# Patient Record
Sex: Female | Born: 1990 | Race: White | Hispanic: No | Marital: Single | Smoking: Current some day smoker
Health system: Southern US, Community
[De-identification: ages and names within clinical notes are randomized; demographics above are authoritative.]

## PROBLEM LIST (undated history)

## (undated) HISTORY — PX: NO PAST SURGERIES: SHX2092

---
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
## 2009-06-05 ENCOUNTER — Encounter: Payer: Self-pay | Admitting: Obstetrics & Gynecology

## 2009-06-05 ENCOUNTER — Ambulatory Visit: Payer: Self-pay | Admitting: Obstetrics & Gynecology

## 2009-06-05 LAB — CONVERTED CEMR LAB
Antibody Screen: NEGATIVE
Basophils Absolute: 0 10*3/uL (ref 0.0–0.1)
Eosinophils Absolute: 0 10*3/uL (ref 0.0–0.7)
Eosinophils Relative: 0 % (ref 0–5)
Lymphocytes Relative: 21 % (ref 12–46)
Monocytes Relative: 5 % (ref 3–12)
Neutro Abs: 8.1 10*3/uL — ABNORMAL HIGH (ref 1.7–7.7)
Rh Type: POSITIVE
WBC: 10.9 10*3/uL — ABNORMAL HIGH (ref 4.0–10.5)

## 2009-06-06 ENCOUNTER — Ambulatory Visit (HOSPITAL_COMMUNITY): Admission: RE | Admit: 2009-06-06 | Discharge: 2009-06-06 | Payer: Self-pay | Admitting: Family Medicine

## 2009-06-09 ENCOUNTER — Ambulatory Visit: Payer: Self-pay | Admitting: Obstetrics and Gynecology

## 2009-07-08 ENCOUNTER — Ambulatory Visit: Payer: Self-pay | Admitting: Obstetrics & Gynecology

## 2009-07-08 LAB — CONVERTED CEMR LAB
Hemoglobin: 11 g/dL — ABNORMAL LOW (ref 12.0–15.0)
MCHC: 32 g/dL (ref 30.0–36.0)
MCV: 85.1 fL (ref 78.0–100.0)
RBC: 4.04 M/uL (ref 3.87–5.11)
RDW: 13.7 % (ref 11.5–15.5)

## 2009-07-23 ENCOUNTER — Ambulatory Visit: Payer: Self-pay | Admitting: Obstetrics & Gynecology

## 2009-08-12 ENCOUNTER — Ambulatory Visit: Payer: Self-pay | Admitting: Obstetrics & Gynecology

## 2009-08-13 ENCOUNTER — Encounter: Payer: Self-pay | Admitting: Obstetrics & Gynecology

## 2009-08-26 ENCOUNTER — Ambulatory Visit: Payer: Self-pay | Admitting: Obstetrics & Gynecology

## 2009-09-03 ENCOUNTER — Observation Stay: Payer: Self-pay

## 2009-09-03 ENCOUNTER — Inpatient Hospital Stay (HOSPITAL_COMMUNITY): Admission: AD | Admit: 2009-09-03 | Discharge: 2009-09-04 | Payer: Self-pay | Admitting: Obstetrics and Gynecology

## 2009-09-03 ENCOUNTER — Ambulatory Visit: Payer: Self-pay | Admitting: Family

## 2009-09-09 ENCOUNTER — Ambulatory Visit: Payer: Self-pay | Admitting: Family Medicine

## 2009-09-09 ENCOUNTER — Encounter: Payer: Self-pay | Admitting: Obstetrics & Gynecology

## 2009-09-10 ENCOUNTER — Encounter: Payer: Self-pay | Admitting: Obstetrics & Gynecology

## 2009-09-10 LAB — CONVERTED CEMR LAB
Chlamydia, DNA Probe: NEGATIVE
GC Probe Amp, Genital: NEGATIVE

## 2009-09-16 ENCOUNTER — Ambulatory Visit: Payer: Self-pay | Admitting: Obstetrics & Gynecology

## 2009-09-29 ENCOUNTER — Inpatient Hospital Stay (HOSPITAL_COMMUNITY): Admission: AD | Admit: 2009-09-29 | Discharge: 2009-10-01 | Payer: Self-pay | Admitting: Obstetrics & Gynecology

## 2009-09-29 ENCOUNTER — Ambulatory Visit: Payer: Self-pay | Admitting: Obstetrics and Gynecology

## 2009-11-04 ENCOUNTER — Ambulatory Visit: Payer: Self-pay | Admitting: Family Medicine

## 2009-11-05 ENCOUNTER — Ambulatory Visit: Payer: Self-pay | Admitting: Obstetrics & Gynecology

## 2010-05-23 LAB — URINALYSIS, DIPSTICK ONLY
Leukocytes, UA: NEGATIVE
Nitrite: NEGATIVE
Urobilinogen, UA: 0.2 mg/dL (ref 0.0–1.0)

## 2010-05-23 LAB — CBC
HCT: 34.9 % — ABNORMAL LOW (ref 36.0–46.0)
MCHC: 33 g/dL (ref 30.0–36.0)
Platelets: 201 10*3/uL (ref 150–400)
RDW: 15.2 % (ref 11.5–15.5)

## 2010-05-23 LAB — COMPREHENSIVE METABOLIC PANEL
ALT: 13 U/L (ref 0–35)
Creatinine, Ser: 0.52 mg/dL (ref 0.4–1.2)
GFR calc non Af Amer: >60 mL/min (ref >60)
Glucose, Bld: 93 mg/dL (ref 70–99)
Potassium: 3.7 mEq/L (ref 3.5–5.1)

## 2010-05-23 LAB — URIC ACID: Uric Acid, Serum: 4.7 mg/dL (ref 2.4–7.0)

## 2010-07-21 NOTE — Assessment & Plan Note (Signed)
NAME:  Shelby Mills, Shelby Mills                  ACCOUNT NO.:  [id number]   MEDICAL RECORD NO.:  [id number]          PATIENT TYPE:  POB   LOCATION:  [facility]         FACILITY:  [facility]   PHYSICIAN:  Tinnie Gens, MD        DATE OF BIRTH:  1991-01-26   DATE OF SERVICE:  11/04/2009                                  CLINIC NOTE   CHIEF COMPLAINT:  Postpartum check.   HISTORY OF PRESENT ILLNESS:  The patient is a 20 year old gravida 2,  para 2 who is status post a vaginal delivery on September 29, 2009, had a  female 8 pounds 9 ounces.  She started breastfeeding; however, she  stopped for now.  She did have protected intercourse approximately 2  weeks ago but desired pregnancy test today.  Pregnancy test today is  negative.  The patient reports that her mood is not well.  She was  depressed during pregnancy.  She was placed on Zoloft initially and then  that was changed to Elavil per the patient's requests.  She is not  wanting anything that will make her tired.  After lengthy discussion we  had regarding risks, benefits of treatment versus not, we decided to go  with Wellbutrin as potential treatment for depression.  The patient does  desire IUD for birth control.   PHYSICAL EXAMINATION:  VITAL SIGNS:  Today, vitals are as in the chart.  GENERAL:  She is well developed, well nourished in no acute distress.  ABDOMEN:  Soft, nontender, nondistended.  GU:  Normal external female genitalia.  BUS is normal.  Vagina is pink  and rugated.  Cervix is parous without lesion.  Uterus is small, well  involuted, mid position.  No adnexal mass or tenderness.   IMPRESSION:  1. Gynecological exam with postpartum check.  2. Undesired fertility.  3. Depression.   PLAN:  1. Wellbutrin 75 mg p.o. b.i.d., start today.  2. Return in 1-2 days for Mirena IUD.  The patient had no positive      cultures in her Pap.  There is no real contraindication for IUD.      Risks and benefits of the IUD were discussed  including side      effects.  The patient understands these and agrees to proceed.           ______________________________  Tinnie Gens, MD     TP/MEDQ  D:  11/04/2009  T:  11/05/2009  Job:  [id number]

## 2012-04-19 ENCOUNTER — Emergency Department: Payer: Self-pay | Admitting: Urology

## 2012-04-23 ENCOUNTER — Emergency Department: Payer: Self-pay | Admitting: Emergency Medicine

## 2015-03-09 NOTE — L&D Delivery Note (Signed)
Patient is 25 y.o. G3P3001 [redacted]w[redacted]d admitted in active labor. She progressed quickly to pushing.   Delivery Note At 11:37 AM a viable female was delivered via Vaginal, Spontaneous Delivery (Presentation:Occiput Anterior).  APGAR: 4, 7; weight 7 lb 6.7 oz (3365 g).   Placenta status: Intact, Spontaneous.  Cord: 3 vessels with the following complications: .  Cord pH: not collected  Anesthesia: None  Episiotomy: None Lacerations: None Suture Repair: None Est. Blood Loss (mL): 100  Mom to postpartum.  Baby to Couplet care / Skin to Skin.  Juanita Craver [facility] 07/18/2015, 2:50 PM

## 2015-04-18 ENCOUNTER — Other Ambulatory Visit (HOSPITAL_COMMUNITY)
Admission: RE | Admit: 2015-04-18 | Discharge: 2015-04-18 | Disposition: A | Discharge status: Home / Self Care | Payer: Medicaid Other | Source: Ambulatory Visit | Attending: Obstetrics & Gynecology | Admitting: Obstetrics & Gynecology

## 2015-04-18 ENCOUNTER — Ambulatory Visit (INDEPENDENT_AMBULATORY_CARE_PROVIDER_SITE_OTHER): Payer: Medicaid Other | Admitting: Obstetrics & Gynecology

## 2015-04-18 ENCOUNTER — Encounter: Payer: Self-pay | Admitting: Obstetrics & Gynecology

## 2015-04-18 VITALS — BP 122/78 | HR 80 | Wt 246.0 lb

## 2015-04-18 DIAGNOSIS — O0932 Supervision of pregnancy with insufficient antenatal care, second trimester: Secondary | ICD-10-CM | POA: Diagnosis not present

## 2015-04-18 DIAGNOSIS — Z8632 Personal history of gestational diabetes: Secondary | ICD-10-CM

## 2015-04-18 DIAGNOSIS — Z124 Encounter for screening for malignant neoplasm of cervix: Secondary | ICD-10-CM

## 2015-04-18 DIAGNOSIS — Z3482 Encounter for supervision of other normal pregnancy, second trimester: Secondary | ICD-10-CM | POA: Diagnosis not present

## 2015-04-18 DIAGNOSIS — F17210 Nicotine dependence, cigarettes, uncomplicated: Secondary | ICD-10-CM

## 2015-04-18 DIAGNOSIS — Z720 Tobacco use: Secondary | ICD-10-CM

## 2015-04-18 DIAGNOSIS — Z3480 Encounter for supervision of other normal pregnancy, unspecified trimester: Secondary | ICD-10-CM

## 2015-04-18 DIAGNOSIS — Z113 Encounter for screening for infections with a predominantly sexual mode of transmission: Secondary | ICD-10-CM | POA: Diagnosis present

## 2015-04-18 DIAGNOSIS — Z01419 Encounter for gynecological examination (general) (routine) without abnormal findings: Secondary | ICD-10-CM | POA: Diagnosis not present
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
## 2015-04-21 ENCOUNTER — Encounter: Payer: Self-pay | Admitting: *Deleted

## 2015-04-21 LAB — CYTOLOGY - PAP

## 2015-04-22 ENCOUNTER — Telehealth: Payer: Self-pay | Admitting: *Deleted

## 2015-04-22 NOTE — Telephone Encounter (Signed)
Called pt to inform her of + CT on pap smear, no answer, left message to call the office.

## 2015-04-25 ENCOUNTER — Ambulatory Visit (HOSPITAL_COMMUNITY): Payer: Medicaid Other

## 2015-04-25 ENCOUNTER — Ambulatory Visit (HOSPITAL_COMMUNITY)
Admission: RE | Admit: 2015-04-25 | Discharge: 2015-04-25 | Disposition: A | Discharge status: Home / Self Care | Payer: Medicaid Other | Source: Ambulatory Visit | Attending: Obstetrics & Gynecology | Admitting: Obstetrics & Gynecology

## 2015-04-25 DIAGNOSIS — O0932 Supervision of pregnancy with insufficient antenatal care, second trimester: Secondary | ICD-10-CM | POA: Diagnosis not present

## 2015-04-25 DIAGNOSIS — O09292 Supervision of pregnancy with other poor reproductive or obstetric history, second trimester: Secondary | ICD-10-CM | POA: Diagnosis present

## 2015-04-25 DIAGNOSIS — Z3A26 26 weeks gestation of pregnancy: Secondary | ICD-10-CM | POA: Diagnosis not present

## 2015-04-25 DIAGNOSIS — Z36 Encounter for antenatal screening of mother: Secondary | ICD-10-CM | POA: Insufficient documentation

## 2015-04-25 DIAGNOSIS — Z3480 Encounter for supervision of other normal pregnancy, unspecified trimester: Secondary | ICD-10-CM

## 2015-04-25 DIAGNOSIS — Z3482 Encounter for supervision of other normal pregnancy, second trimester: Secondary | ICD-10-CM

## 2015-04-29 ENCOUNTER — Telehealth: Payer: Self-pay | Admitting: *Deleted

## 2015-04-29 DIAGNOSIS — A749 Chlamydial infection, unspecified: Secondary | ICD-10-CM

## 2015-04-29 DIAGNOSIS — O98819 Other maternal infectious and parasitic diseases complicating pregnancy, unspecified trimester: Principal | ICD-10-CM

## 2015-04-29 MED ORDER — AZITHROMYCIN 500 MG PO TABS: 1000.0000 mg | ORAL_TABLET | Freq: Once | ORAL | Status: DC
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
## 2015-04-30 ENCOUNTER — Encounter (HOSPITAL_COMMUNITY): Payer: Self-pay

## 2015-04-30 DIAGNOSIS — Z3480 Encounter for supervision of other normal pregnancy, unspecified trimester: Secondary | ICD-10-CM | POA: Insufficient documentation

## 2015-05-16 ENCOUNTER — Encounter: Payer: Medicaid Other | Admitting: Obstetrics & Gynecology

## 2015-05-26 ENCOUNTER — Ambulatory Visit (INDEPENDENT_AMBULATORY_CARE_PROVIDER_SITE_OTHER): Payer: Medicaid Other | Admitting: Obstetrics & Gynecology

## 2015-05-26 ENCOUNTER — Encounter: Payer: Self-pay | Admitting: Obstetrics & Gynecology

## 2015-05-26 ENCOUNTER — Other Ambulatory Visit (HOSPITAL_COMMUNITY)
Admission: RE | Admit: 2015-05-26 | Discharge: 2015-05-26 | Disposition: A | Discharge status: Home / Self Care | Payer: Medicaid Other | Source: Ambulatory Visit | Attending: Obstetrics & Gynecology | Admitting: Obstetrics & Gynecology

## 2015-05-26 VITALS — BP 121/75 | HR 79 | Ht 70.0 in | Wt 239.0 lb

## 2015-05-26 DIAGNOSIS — Z36 Encounter for antenatal screening of mother: Secondary | ICD-10-CM | POA: Diagnosis not present

## 2015-05-26 DIAGNOSIS — O98312 Other infections with a predominantly sexual mode of transmission complicating pregnancy, second trimester: Secondary | ICD-10-CM

## 2015-05-26 DIAGNOSIS — Z3483 Encounter for supervision of other normal pregnancy, third trimester: Secondary | ICD-10-CM

## 2015-05-26 DIAGNOSIS — Z113 Encounter for screening for infections with a predominantly sexual mode of transmission: Secondary | ICD-10-CM | POA: Diagnosis present

## 2015-05-26 DIAGNOSIS — Z23 Encounter for immunization: Secondary | ICD-10-CM | POA: Diagnosis not present

## 2015-05-26 DIAGNOSIS — O98812 Other maternal infectious and parasitic diseases complicating pregnancy, second trimester: Secondary | ICD-10-CM

## 2015-05-26 DIAGNOSIS — Z634 Disappearance and death of family member: Secondary | ICD-10-CM | POA: Insufficient documentation

## 2015-05-26 DIAGNOSIS — Z0489 Encounter for examination and observation for other specified reasons: Secondary | ICD-10-CM

## 2015-05-26 DIAGNOSIS — O2343 Unspecified infection of urinary tract in pregnancy, third trimester: Secondary | ICD-10-CM

## 2015-05-26 DIAGNOSIS — A749 Chlamydial infection, unspecified: Secondary | ICD-10-CM

## 2015-05-26 DIAGNOSIS — IMO0002 Reserved for concepts with insufficient information to code with codable children: Secondary | ICD-10-CM

## 2015-05-26 NOTE — Progress Notes (Signed)
Subjective:  Shelby Mills is a 25 y.o. G3P2000 at [redacted]w[redacted]d being seen today for ongoing prenatal care.  She is currently monitored for the following issues for this low-risk pregnancy and has Encounter for supervision of other normal pregnancy and Chlamydia infection affecting pregnancy in second trimester on her problem list.  Patient reports no complaints.  Contractions: Not present. Vag. Bleeding: None.  Movement: Present. Denies leaking of fluid.   The following portions of the patient's history were reviewed and updated as appropriate: allergies, current medications, past family history, past medical history, past social history, past surgical history and problem list. Problem list updated.  Objective:   Filed Vitals:   05/26/15 1329 05/26/15 1330  BP: 121/75   Pulse: 79   Height:  5\' 10"  (1.778 m)  Weight: 239 lb (108.41 kg)     Fetal Status: Fetal Heart Rate (bpm): 145 Fundal Height: 31 cm Movement: Present     General:  Alert, oriented and cooperative. Patient is in no acute distress.  Skin: Skin is warm and dry. No rash noted.   Cardiovascular: Normal heart rate noted  Respiratory: Normal respiratory effort, no problems with respiration noted  Abdomen: Soft, gravid, appropriate for gestational age. Pain/Pressure: Absent     Pelvic: Vag. Bleeding: None Vag D/C Character: Thin  Cervical exam deferred        Extremities: Normal range of motion.  Edema: None  Mental Status: Normal mood and affect. Normal behavior. Normal judgment and thought content.   Urinalysis: Urine Protein: Trace Urine Glucose: Negative  Assessment and Plan:  Pregnancy: G3P2000 at [redacted]w[redacted]d  1. Evaluate anatomy not seen on prior sonogram Limited anatomy scan - [place] MFM OB FOLLOW UP; Future  2. Chlamydia infection affecting pregnancy in second trimester Test of cure needed today. - GC/Chlamydia probe amp ([place])not at [facility]  3. Need for Tdap vaccination - Tdap vaccine greater than or equal to 7yo IM;  Standing - Tdap vaccine greater than or equal to 7yo IM  4. Encounter for supervision of other normal pregnancy in third trimester Prenatal labs, 1 hr GTT will be done later this week - Culture, OB Urine Preterm labor symptoms and general obstetric precautions including but not limited to vaginal bleeding, contractions, leaking of fluid and fetal movement were reviewed in detail with the patient. Please refer to After Visit Summary for other counseling recommendations.  Return in about 2 days (around 05/28/2015) for 1 hr GTT, 3rd trimester labs.  Then OB visit in 2 weeks.Osborne Oman, MD

## 2015-05-26 NOTE — Patient Instructions (Signed)
Return to clinic for any obstetric concerns or go to [facility]U for evaluation  

## 2015-05-27 LAB — GC/CHLAMYDIA PROBE AMP (~~LOC~~) NOT AT ARMC
Chlamydia: NEGATIVE
Neisseria Gonorrhea: NEGATIVE

## 2015-05-28 ENCOUNTER — Encounter: Payer: Self-pay | Admitting: *Deleted

## 2015-05-28 ENCOUNTER — Other Ambulatory Visit (INDEPENDENT_AMBULATORY_CARE_PROVIDER_SITE_OTHER): Payer: Medicaid Other | Admitting: *Deleted

## 2015-05-28 ENCOUNTER — Ambulatory Visit (HOSPITAL_COMMUNITY): Payer: Medicaid Other

## 2015-05-28 DIAGNOSIS — Z36 Encounter for antenatal screening of mother: Secondary | ICD-10-CM

## 2015-05-28 DIAGNOSIS — Z3483 Encounter for supervision of other normal pregnancy, third trimester: Secondary | ICD-10-CM

## 2015-05-28 LAB — CULTURE, OB URINE: Colony Count: >=100000

## 2015-05-29 LAB — GLUCOSE TOLERANCE, 1 HOUR (50G) W/O FASTING: GLUCOSE, 1 HR, GESTATIONAL: 144 mg/dL — AB (ref <140)

## 2015-05-29 MED ORDER — SULFAMETHOXAZOLE-TRIMETHOPRIM 800-160 MG PO TABS: 1.0000 | ORAL_TABLET | Freq: Two times a day (BID) | ORAL | Status: DC

## 2015-05-29 NOTE — Addendum Note (Signed)
Addended by: Verita Schneiders A on: 05/29/2015 12:21 PM   Modules accepted: Orders

## 2015-05-30 LAB — PRENATAL PROFILE (SOLSTAS)
ANTIBODY SCREEN: NEGATIVE
BASOS ABS: 0 10*3/uL (ref 0.0–0.1)
Basophils Relative: 0 % (ref 0–1)
Eosinophils Absolute: 0.1 10*3/uL (ref 0.0–0.7)
Eosinophils Relative: 1 % (ref 0–5)
HCT: 35.1 % — ABNORMAL LOW (ref 36.0–46.0)
HEMOGLOBIN: 11.6 g/dL — AB (ref 12.0–15.0)
HIV 1&2 Ab, 4th Generation: NONREACTIVE
Hepatitis B Surface Ag: NEGATIVE
LYMPHS ABS: 1.8 10*3/uL (ref 0.7–4.0)
LYMPHS PCT: 18 % (ref 12–46)
MCH: 27.3 pg (ref 26.0–34.0)
MCHC: 33 g/dL (ref 30.0–36.0)
MCV: 82.6 fL (ref 78.0–100.0)
MPV: 10.8 fL (ref 8.6–12.4)
Monocytes Absolute: 0.5 10*3/uL (ref 0.1–1.0)
Monocytes Relative: 5 % (ref 3–12)
NEUTROS PCT: 76 % (ref 43–77)
Neutro Abs: 7.7 10*3/uL (ref 1.7–7.7)
PLATELETS: 212 10*3/uL (ref 150–400)
RBC: 4.25 MIL/uL (ref 3.87–5.11)
RDW: 14.1 % (ref 11.5–15.5)
RUBELLA: 2.04 {index} — AB (ref <0.90)
Rh Type: POSITIVE
WBC: 10.1 10*3/uL (ref 4.0–10.5)

## 2015-06-02 ENCOUNTER — Other Ambulatory Visit: Payer: Self-pay | Admitting: Obstetrics & Gynecology

## 2015-06-02 ENCOUNTER — Ambulatory Visit (HOSPITAL_COMMUNITY)
Admission: RE | Admit: 2015-06-02 | Discharge: 2015-06-02 | Disposition: A | Discharge status: Home / Self Care | Payer: Medicaid Other | Source: Ambulatory Visit | Attending: Obstetrics & Gynecology | Admitting: Obstetrics & Gynecology

## 2015-06-02 DIAGNOSIS — O09292 Supervision of pregnancy with other poor reproductive or obstetric history, second trimester: Secondary | ICD-10-CM

## 2015-06-02 DIAGNOSIS — O3412 Maternal care for benign tumor of corpus uteri, second trimester: Secondary | ICD-10-CM

## 2015-06-02 DIAGNOSIS — O0932 Supervision of pregnancy with insufficient antenatal care, second trimester: Secondary | ICD-10-CM | POA: Insufficient documentation

## 2015-06-02 DIAGNOSIS — D259 Leiomyoma of uterus, unspecified: Secondary | ICD-10-CM

## 2015-06-02 DIAGNOSIS — Z36 Encounter for antenatal screening of mother: Secondary | ICD-10-CM | POA: Insufficient documentation

## 2015-06-02 DIAGNOSIS — Z8632 Personal history of gestational diabetes: Secondary | ICD-10-CM

## 2015-06-02 DIAGNOSIS — IMO0002 Reserved for concepts with insufficient information to code with codable children: Secondary | ICD-10-CM

## 2015-06-02 DIAGNOSIS — Z0489 Encounter for examination and observation for other specified reasons: Secondary | ICD-10-CM

## 2015-06-02 DIAGNOSIS — O09293 Supervision of pregnancy with other poor reproductive or obstetric history, third trimester: Secondary | ICD-10-CM | POA: Insufficient documentation

## 2015-06-02 DIAGNOSIS — O3413 Maternal care for benign tumor of corpus uteri, third trimester: Secondary | ICD-10-CM | POA: Diagnosis not present

## 2015-06-02 DIAGNOSIS — Z3A32 32 weeks gestation of pregnancy: Secondary | ICD-10-CM

## 2015-06-03 ENCOUNTER — Telehealth: Payer: Self-pay | Admitting: *Deleted

## 2015-06-03 NOTE — Telephone Encounter (Signed)
-----   Message from Osborne Oman, MD sent at 05/29/2015 12:23 PM EDT ----- 1 hr GTT 144.  Please schedule for 3 hr GTT.

## 2015-06-03 NOTE — Telephone Encounter (Signed)
-----   Message from Osborne Oman, MD sent at 05/29/2015 12:22 PM EDT ----- Bactrim DS prescribed for UTI, please call to inform patient of results and advise her to pick up prescription.

## 2015-06-03 NOTE — Telephone Encounter (Signed)
Informed pt of urine cx result and need for antibiotic treatment, reviewed medication regimen with pt, acknowledged instructions.

## 2015-06-03 NOTE — Telephone Encounter (Signed)
Informed pt of abn 1 hr GTT and the need for 3 hr GTT, pt scheduled for Monday 06-09-15 at 0830.

## 2015-06-09 ENCOUNTER — Ambulatory Visit (INDEPENDENT_AMBULATORY_CARE_PROVIDER_SITE_OTHER): Payer: Medicaid Other | Admitting: Obstetrics & Gynecology

## 2015-06-09 ENCOUNTER — Other Ambulatory Visit (INDEPENDENT_AMBULATORY_CARE_PROVIDER_SITE_OTHER): Payer: Medicaid Other | Admitting: *Deleted

## 2015-06-09 VITALS — BP 111/74 | HR 76 | Wt 243.0 lb

## 2015-06-09 DIAGNOSIS — Z3483 Encounter for supervision of other normal pregnancy, third trimester: Secondary | ICD-10-CM

## 2015-06-09 DIAGNOSIS — R7309 Other abnormal glucose: Secondary | ICD-10-CM

## 2015-06-09 DIAGNOSIS — R7302 Impaired glucose tolerance (oral): Secondary | ICD-10-CM

## 2015-06-09 LAB — GLUCOSE, POCT (MANUAL RESULT ENTRY)
POC GLUCOSE: 150 mg/dL — AB (ref 70–99)
POC GLUCOSE: 88 mg/dL (ref 70–99)
POC Glucose: 83 mg/dl (ref 70–99)
POC Glucose: 189 mg/dl — AB (ref 70–99)
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
## 2015-06-09 NOTE — Patient Instructions (Signed)
Return to clinic for any scheduled appointments or obstetric concerns, or go to [facility]U for evaluation  

## 2015-06-09 NOTE — Progress Notes (Signed)
Pt failed 1 hr GTT, here today for 3 hr GTT.  Pt is a difficult stick, spoke to Dr Harolyn Rutherford about doing 3 hr GTT as CBG checks, verified that it was okay to proceed with CBG checks. Pt had one abnormal reading during the 3 hr GTT, does not have GDM.

## 2015-06-24 ENCOUNTER — Encounter: Payer: Self-pay | Admitting: Obstetrics and Gynecology

## 2015-06-24 ENCOUNTER — Ambulatory Visit (INDEPENDENT_AMBULATORY_CARE_PROVIDER_SITE_OTHER): Payer: Medicaid Other | Admitting: Obstetrics and Gynecology

## 2015-06-24 VITALS — BP 113/74 | HR 80 | Wt 244.0 lb

## 2015-06-24 DIAGNOSIS — O98312 Other infections with a predominantly sexual mode of transmission complicating pregnancy, second trimester: Secondary | ICD-10-CM

## 2015-06-24 DIAGNOSIS — A749 Chlamydial infection, unspecified: Secondary | ICD-10-CM

## 2015-06-24 DIAGNOSIS — Z3483 Encounter for supervision of other normal pregnancy, third trimester: Secondary | ICD-10-CM

## 2015-06-24 DIAGNOSIS — O98812 Other maternal infectious and parasitic diseases complicating pregnancy, second trimester: Secondary | ICD-10-CM
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
## 2015-07-01 ENCOUNTER — Other Ambulatory Visit (HOSPITAL_COMMUNITY)
Admission: RE | Admit: 2015-07-01 | Discharge: 2015-07-01 | Disposition: A | Discharge status: Home / Self Care | Payer: Medicaid Other | Source: Ambulatory Visit | Attending: Obstetrics & Gynecology | Admitting: Obstetrics & Gynecology

## 2015-07-01 ENCOUNTER — Ambulatory Visit (INDEPENDENT_AMBULATORY_CARE_PROVIDER_SITE_OTHER): Payer: Medicaid Other | Admitting: Obstetrics & Gynecology

## 2015-07-01 VITALS — BP 116/75 | HR 70 | Wt 252.0 lb

## 2015-07-01 DIAGNOSIS — Z36 Encounter for antenatal screening of mother: Secondary | ICD-10-CM

## 2015-07-01 DIAGNOSIS — Z113 Encounter for screening for infections with a predominantly sexual mode of transmission: Secondary | ICD-10-CM | POA: Insufficient documentation

## 2015-07-01 DIAGNOSIS — Z3483 Encounter for supervision of other normal pregnancy, third trimester: Secondary | ICD-10-CM

## 2015-07-01 NOTE — Patient Instructions (Signed)
Return to clinic for any scheduled appointments or obstetric concerns, or go to [facility]U for evaluation  Thank you for enrolling in [place]. Please follow the instructions below to securely access your online medical record. MyChart allows you to send messages to your doctor, view your test results, manage appointments, and more.   How Do I Sign Up? 1. In your Internet browser, go to [employer] and enter [email or web address]. 2. Click on the Sign Up Now link in the Sign In box. You will see the New Member Sign Up page. 3. Enter your MyChart Access Code exactly as it appears below. You will not need to use this code after you've completed the sign-up process. If you do not sign up before the expiration date, you must request a new code.  MyChart Access Code: [email or web address] Expires: 08/08/2015  8:43 AM  4. Enter your Social Security Number ([id number]) and Date of Birth (mm/dd/yyyy) as indicated and click Submit. You will be taken to the next sign-up page. 5. Create a MyChart ID. This will be your MyChart login ID and cannot be changed, so think of one that is secure and easy to remember. 6. Create a MyChart password. You can change your password at any time. 7. Enter your Password Reset Question and Answer. This can be used at a later time if you forget your password.  8. Enter your e-mail address. You will receive e-mail notification when new information is available in [facility]. 9. Click Sign Up. You can now view your medical record.   Additional Information Remember, MyChart is NOT to be used for urgent needs. For medical emergencies, dial 911.
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
## 2015-07-02 LAB — GC/CHLAMYDIA PROBE AMP (~~LOC~~) NOT AT ARMC
CHLAMYDIA, DNA PROBE: NEGATIVE
NEISSERIA GONORRHEA: NEGATIVE

## 2015-07-02 LAB — CULTURE, BETA STREP (GROUP B ONLY)

## 2015-07-03 ENCOUNTER — Encounter: Payer: Self-pay | Admitting: Obstetrics & Gynecology

## 2015-07-03 DIAGNOSIS — O99820 Streptococcus B carrier state complicating pregnancy: Secondary | ICD-10-CM | POA: Insufficient documentation

## 2015-07-07 ENCOUNTER — Encounter: Payer: Medicaid Other | Admitting: Obstetrics & Gynecology

## 2015-07-08 ENCOUNTER — Ambulatory Visit (INDEPENDENT_AMBULATORY_CARE_PROVIDER_SITE_OTHER): Payer: Medicaid Other | Admitting: Obstetrics & Gynecology

## 2015-07-08 VITALS — BP 117/79 | HR 67 | Wt 250.0 lb

## 2015-07-08 DIAGNOSIS — Z634 Disappearance and death of family member: Secondary | ICD-10-CM

## 2015-07-08 DIAGNOSIS — Z3483 Encounter for supervision of other normal pregnancy, third trimester: Secondary | ICD-10-CM

## 2015-07-08 DIAGNOSIS — Z22330 Carrier of Group B streptococcus: Secondary | ICD-10-CM

## 2015-07-08 DIAGNOSIS — A749 Chlamydial infection, unspecified: Secondary | ICD-10-CM

## 2015-07-08 DIAGNOSIS — O98312 Other infections with a predominantly sexual mode of transmission complicating pregnancy, second trimester: Secondary | ICD-10-CM

## 2015-07-08 DIAGNOSIS — O99820 Streptococcus B carrier state complicating pregnancy: Secondary | ICD-10-CM

## 2015-07-08 DIAGNOSIS — O98812 Other maternal infectious and parasitic diseases complicating pregnancy, second trimester: Principal | ICD-10-CM
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
## 2015-07-14 ENCOUNTER — Encounter: Payer: Medicaid Other | Admitting: Family Medicine

## 2015-07-18 ENCOUNTER — Inpatient Hospital Stay (HOSPITAL_COMMUNITY)
Admission: AD | Admit: 2015-07-18 | Discharge: 2015-07-20 | DRG: 775 | Disposition: A | Discharge status: Home / Self Care | Payer: Medicaid Other | Source: Ambulatory Visit | Attending: Obstetrics & Gynecology | Admitting: Obstetrics & Gynecology

## 2015-07-18 ENCOUNTER — Encounter (HOSPITAL_COMMUNITY): Payer: Self-pay | Admitting: *Deleted

## 2015-07-18 DIAGNOSIS — Z3A38 38 weeks gestation of pregnancy: Secondary | ICD-10-CM

## 2015-07-18 DIAGNOSIS — O99824 Streptococcus B carrier state complicating childbirth: Secondary | ICD-10-CM | POA: Diagnosis present

## 2015-07-18 DIAGNOSIS — Z3483 Encounter for supervision of other normal pregnancy, third trimester: Secondary | ICD-10-CM

## 2015-07-18 DIAGNOSIS — Z87891 Personal history of nicotine dependence: Secondary | ICD-10-CM

## 2015-07-18 DIAGNOSIS — IMO0001 Reserved for inherently not codable concepts without codable children: Secondary | ICD-10-CM

## 2015-07-18 DIAGNOSIS — O99820 Streptococcus B carrier state complicating pregnancy: Secondary | ICD-10-CM

## 2015-07-18 DIAGNOSIS — Z23 Encounter for immunization: Secondary | ICD-10-CM | POA: Diagnosis not present

## 2015-07-18 LAB — CBC
HEMATOCRIT: 37.5 % (ref 36.0–46.0)
Hemoglobin: 12.9 g/dL (ref 12.0–15.0)
MCH: 27.5 pg (ref 26.0–34.0)
MCHC: 34.4 g/dL (ref 30.0–36.0)
MCV: 80 fL (ref 78.0–100.0)
Platelets: 250 10*3/uL (ref 150–400)
RBC: 4.69 MIL/uL (ref 3.87–5.11)
RDW: 14.2 % (ref 11.5–15.5)
WBC: 11.3 10*3/uL — ABNORMAL HIGH (ref 4.0–10.5)

## 2015-07-18 LAB — TYPE AND SCREEN
ABO/RH(D): A POS
Antibody Screen: NEGATIVE

## 2015-07-18 LAB — ABO/RH: ABO/RH(D): A POS

## 2015-07-18 MED ORDER — ZOLPIDEM TARTRATE 5 MG PO TABS: 5.0000 mg | ORAL_TABLET | Freq: Every evening | ORAL | Status: DC | PRN

## 2015-07-18 MED ORDER — AMPICILLIN SODIUM 2 G IJ SOLR
2.0000 g | Freq: Once | INTRAMUSCULAR | Status: AC
  Administered 2015-07-18: 2 g via INTRAVENOUS
  Filled 2015-07-18: qty 2000

## 2015-07-18 MED ORDER — ACETAMINOPHEN 325 MG PO TABS
650.0000 mg | ORAL_TABLET | ORAL | Status: DC | PRN
  Administered 2015-07-18 – 2015-07-19 (×2): 650 mg via ORAL
  Filled 2015-07-18 (×2): qty 2

## 2015-07-18 MED ORDER — CITRIC ACID-SODIUM CITRATE 334-500 MG/5ML PO SOLN
30.0000 mL | ORAL | Status: DC | PRN
Start: 2015-07-18 — End: 2015-07-18

## 2015-07-18 MED ORDER — OXYCODONE-ACETAMINOPHEN 5-325 MG PO TABS: 2.0000 | ORAL_TABLET | ORAL | Status: DC | PRN

## 2015-07-18 MED ORDER — LIDOCAINE HCL (PF) 1 % IJ SOLN
INTRAMUSCULAR | Status: AC
  Filled 2015-07-18: qty 30

## 2015-07-18 MED ORDER — BENZOCAINE-MENTHOL 20-0.5 % EX AERO
1.0000 "application " | INHALATION_SPRAY | CUTANEOUS | Status: DC | PRN
  Administered 2015-07-18: 1 via TOPICAL
  Filled 2015-07-18: qty 56

## 2015-07-18 MED ORDER — ONDANSETRON HCL 4 MG/2ML IJ SOLN: 4.0000 mg | Freq: Four times a day (QID) | INTRAMUSCULAR | Status: DC | PRN

## 2015-07-18 MED ORDER — DIPHENHYDRAMINE HCL 25 MG PO CAPS: 25.0000 mg | ORAL_CAPSULE | Freq: Four times a day (QID) | ORAL | Status: DC | PRN

## 2015-07-18 MED ORDER — OXYTOCIN BOLUS FROM INFUSION
500.0000 mL | INTRAVENOUS | Status: DC
  Administered 2015-07-18: 500 mL via INTRAVENOUS

## 2015-07-18 MED ORDER — DIBUCAINE 1 % RE OINT: 1.0000 "application " | TOPICAL_OINTMENT | RECTAL | Status: DC | PRN

## 2015-07-18 MED ORDER — SIMETHICONE 80 MG PO CHEW: 80.0000 mg | CHEWABLE_TABLET | ORAL | Status: DC | PRN

## 2015-07-18 MED ORDER — OXYCODONE-ACETAMINOPHEN 5-325 MG PO TABS: 1.0000 | ORAL_TABLET | ORAL | Status: DC | PRN

## 2015-07-18 MED ORDER — ACETAMINOPHEN 325 MG PO TABS: 650.0000 mg | ORAL_TABLET | ORAL | Status: DC | PRN

## 2015-07-18 MED ORDER — COCONUT OIL OIL: 1.0000 "application " | TOPICAL_OIL | Status: DC | PRN

## 2015-07-18 MED ORDER — TETANUS-DIPHTH-ACELL PERTUSSIS 5-2.5-18.5 LF-MCG/0.5 IM SUSP: 0.5000 mL | Freq: Once | INTRAMUSCULAR | Status: DC

## 2015-07-18 MED ORDER — PRENATAL MULTIVITAMIN CH
1.0000 | ORAL_TABLET | Freq: Every day | ORAL | Status: DC
Start: 2015-07-18 — End: 2015-07-20
  Administered 2015-07-19: 1 via ORAL
  Filled 2015-07-18: qty 1

## 2015-07-18 MED ORDER — ONDANSETRON HCL 4 MG PO TABS: 4.0000 mg | ORAL_TABLET | ORAL | Status: DC | PRN

## 2015-07-18 MED ORDER — WITCH HAZEL-GLYCERIN EX PADS: 1.0000 "application " | MEDICATED_PAD | CUTANEOUS | Status: DC | PRN

## 2015-07-18 MED ORDER — SENNOSIDES-DOCUSATE SODIUM 8.6-50 MG PO TABS
2.0000 | ORAL_TABLET | ORAL | Status: DC
Start: 2015-07-19 — End: 2015-07-20
  Administered 2015-07-19 (×2): 2 via ORAL
  Filled 2015-07-18 (×2): qty 2

## 2015-07-18 MED ORDER — FLEET ENEMA 7-19 GM/118ML RE ENEM
1.0000 | ENEMA | RECTAL | Status: DC | PRN
Start: 2015-07-18 — End: 2015-07-18

## 2015-07-18 MED ORDER — LACTATED RINGERS IV SOLN
INTRAVENOUS | Status: DC
  Administered 2015-07-18: 11:00:00 via INTRAVENOUS

## 2015-07-18 MED ORDER — OXYTOCIN 40 UNITS IN LACTATED RINGERS INFUSION - SIMPLE MED: 2.5000 [IU]/h | INTRAVENOUS | Status: DC

## 2015-07-18 MED ORDER — LIDOCAINE HCL (PF) 1 % IJ SOLN
30.0000 mL | INTRAMUSCULAR | Status: DC | PRN
  Filled 2015-07-18: qty 30

## 2015-07-18 MED ORDER — LACTATED RINGERS IV SOLN: 500.0000 mL | INTRAVENOUS | Status: DC | PRN

## 2015-07-18 MED ORDER — ONDANSETRON HCL 4 MG/2ML IJ SOLN: 4.0000 mg | INTRAMUSCULAR | Status: DC | PRN

## 2015-07-18 MED ORDER — IBUPROFEN 600 MG PO TABS
600.0000 mg | ORAL_TABLET | Freq: Four times a day (QID) | ORAL | Status: DC
  Administered 2015-07-18 – 2015-07-20 (×8): 600 mg via ORAL
  Filled 2015-07-18 (×7): qty 1

## 2015-07-18 MED ORDER — OXYTOCIN 40 UNITS IN LACTATED RINGERS INFUSION - SIMPLE MED
INTRAVENOUS | Status: AC
  Filled 2015-07-18: qty 1000
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
## 2015-07-18 NOTE — Progress Notes (Signed)
Report to [facility] charge, admitted to room 167
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
## 2015-07-18 NOTE — MAU Note (Signed)
Reports ROM around 0830

## 2015-07-18 NOTE — MAU Note (Signed)
Contractions started yesterday, now about every 5 min.  rom at 0830, still gushing. No problems with preg.
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
## 2015-07-19 LAB — RPR: RPR Ser Ql: NONREACTIVE

## 2015-07-19 NOTE — Progress Notes (Signed)
Post Partum Day 1 Subjective:  Shelby Mills is a 25 y.o. G3P3001 [redacted]w[redacted]d s/p SVD.  No acute events overnight.  Pt denies problems with ambulating, voiding or po intake.  She denies nausea or vomiting.  Pain is well controlled.  She has had flatus. She has not had bowel movement.  Lochia minimal.  Plan for birth control is Mirena IUD.  Method of Feeding: breast  Objective: Blood pressure 111/73, pulse 61, temperature 98.1 F (36.7 C), temperature source Oral, resp. rate 16, SpO2 100 %, unknown if currently breastfeeding.  Physical Exam:  General: alert, cooperative and no distress Lochia:normal flow Chest: CTAB Heart: RRR no m/r/g Abdomen: +BS, soft, nontender,  Uterine Fundus: firm, below level of umbilicus DVT Evaluation: No evidence of DVT seen on physical exam. Extremities: No edema   Recent Labs  07/18/15 1055  HGB 12.9  HCT 37.5    Assessment/Plan:  ASSESSMENT: Shelby Mills is a 25 y.o. G3P3001 [redacted]w[redacted]d s/p SVD. Doing well with no complaints.  PLAN: Plan for discharge tomorrow- baby being observed for 48 hours since pt is GBS+ with inadequate tx   LOS: 1 day   Page Spiro 07/19/2015, 7:07 AM   OB fellow attestation Post Partum Day 1 I have seen and examined this patient and agree with above documentation in the medical student's note.   Shelby Mills is a 25 y.o. G3P3001 s/p NSVD.  Pt denies problems with ambulating, voiding or po intake. Pain is well controlled.  Plan for birth control is IUD.  Method of Feeding: breast  PE:  BP 111/73 mmHg  Pulse 61  Temp(Src) 98.1 F (36.7 C) (Oral)  Resp 16  SpO2 100%  Breastfeeding? Unknown Gen: well appearing Heart: reg rate Lungs: normal WOB Fundus firm Ext: soft, no pain, no edema  Plan for discharge: PPD#2  Routine care  Caren Macadam, MD 9:53 AM

## 2015-07-19 NOTE — Lactation Note (Signed)
This note was copied from a baby's chart. Lactation Consultation Note  Patient Name: Shelby Mills [id number] Date: 07/19/2015 Reason for consult: Follow-up assessment Baby at 29 hr of life with a 4% wt loss, 13 bf, 8 wet diapers, and 6 BM. Mom reports bf is going well. She denies breast or nipple pain and thinks baby latches deep and easily. She and FOB stated they can hear the baby swallow. Mom can manually express, colostrum is noted bilaterally. Mom will continue to put baby to breast on demand 8+/24hr and f/u each bf with spoon feeding expressed milk per the supplementing guidelines. She is aware of OP services and support group. She will call as needed for bf support.    Maternal Data    Feeding Feeding Type: Breast Fed Length of feed: 5 min  LATCH Score/Interventions                      Lactation Tools Discussed/Used     Consult Status Consult Status: Follow-up    Denzil Hughes 07/19/2015, 4:42 PM

## 2015-07-20 MED ORDER — IBUPROFEN 600 MG PO TABS: 600.0000 mg | ORAL_TABLET | Freq: Four times a day (QID) | ORAL | Status: AC | PRN

## 2015-07-20 NOTE — Lactation Note (Signed)
This note was copied from a baby's chart. Lactation Consultation Note  Mom states feedings are going well.  Instructed to continue feeding on cue after discharge.  She states she feels like milk is coming in.  Outpatient lactation services and support information reviewed and encouraged prn.  Patient Name: Shelby Mills [id number] Date: 07/20/2015     Maternal Data    Feeding Feeding Type: Breast Fed Length of feed: 10 min  LATCH Score/Interventions                      Lactation Tools Discussed/Used     Consult Status      Ave Filter 07/20/2015, 9:53 AM

## 2015-07-20 NOTE — Discharge Instructions (Signed)
Postpartum Care After Vaginal Delivery °After you deliver your newborn (postpartum period), the usual stay in the hospital is 24-72 hours. If there were problems with your labor or delivery, or if you have other medical problems, you might be in the hospital longer.  °While you are in the hospital, you will receive help and instructions on how to care for yourself and your newborn during the postpartum period.  °While you are in the hospital: °· Be sure to tell your nurses if you have pain or discomfort, as well as where you feel the pain and what makes the pain worse. °· If you had an incision made near your vagina (episiotomy) or if you had some tearing during delivery, the nurses may put ice packs on your episiotomy or tear. The ice packs may help to reduce the pain and swelling. °· If you are breastfeeding, you may feel uncomfortable contractions of your uterus for a couple of weeks. This is normal. The contractions help your uterus get back to normal size. °· It is normal to have some bleeding after delivery. °· For the first 1-3 days after delivery, the flow is red and the amount may be similar to a period. °· It is common for the flow to start and stop. °· In the first few days, you may pass some small clots. Let your nurses know if you begin to pass large clots or your flow increases. °· Do not  flush blood clots down the toilet before having the nurse look at them. °· During the next 3-10 days after delivery, your flow should become more watery and pink or brown-tinged in color. °· Ten to fourteen days after delivery, your flow should be a small amount of yellowish-white discharge. °· The amount of your flow will decrease over the first few weeks after delivery. Your flow may stop in 6-8 weeks. Most women have had their flow stop by 12 weeks after delivery. °· You should change your sanitary pads frequently. °· Wash your hands thoroughly with soap and water for at least 20 seconds after changing pads, using  the toilet, or before holding or feeding your newborn. °· You should feel like you need to empty your bladder within the first 6-8 hours after delivery. °· In case you become weak, lightheaded, or faint, call your nurse before you get out of bed for the first time and before you take a shower for the first time. °· Within the first few days after delivery, your breasts may begin to feel tender and full. This is called engorgement. Breast tenderness usually goes away within 48-72 hours after engorgement occurs. You may also notice milk leaking from your breasts. If you are not breastfeeding, do not stimulate your breasts. Breast stimulation can make your breasts produce more milk. °· Spending as much time as possible with your newborn is very important. During this time, you and your newborn can feel close and get to know each other. Having your newborn stay in your room (rooming in) will help to strengthen the bond with your newborn.  It will give you time to get to know your newborn and become comfortable caring for your newborn. °· Your hormones change after delivery. Sometimes the hormone changes can temporarily cause you to feel sad or tearful. These feelings should not last more than a few days. If these feelings last longer than that, you should talk to your caregiver. °· If desired, talk to your caregiver about methods of family planning or contraception. °·   Talk to your caregiver about immunizations. Your caregiver may want you to have the following immunizations before leaving the hospital:  Tetanus, diphtheria, and pertussis (Tdap) or tetanus and diphtheria (Td) immunization. It is very important that you and your family (including grandparents) or others caring for your newborn are up-to-date with the Tdap or Td immunizations. The Tdap or Td immunization can help protect your newborn from getting ill.  Rubella immunization.  Varicella (chickenpox) immunization.  Influenza immunization. You should  receive this annual immunization if you did not receive the immunization during your pregnancy.   This information is not intended to replace advice given to you by your health care provider. Make sure you discuss any questions you have with your health care provider.   Document Released: 12/20/2006 Document Revised: 11/17/2011 Document Reviewed: 10/20/2011 Elsevier Interactive Patient Education [employer].  Breastfeeding Deciding to breastfeed is one of the best choices you can make for you and your baby. A change in hormones during pregnancy causes your breast tissue to grow and increases the number and size of your milk ducts. These hormones also allow proteins, sugars, and fats from your blood supply to make breast milk in your milk-producing glands. Hormones prevent breast milk from being released before your baby is born as well as prompt milk flow after birth. Once breastfeeding has begun, thoughts of your baby, as well as his or her sucking or crying, can stimulate the release of milk from your milk-producing glands.  BENEFITS OF BREASTFEEDING For Your Baby  Your first milk (colostrum) helps your baby's digestive system function better.  There are antibodies in your milk that help your baby fight off infections.  Your baby has a lower incidence of asthma, allergies, and sudden infant death syndrome.  The nutrients in breast milk are better for your baby than infant formulas and are designed uniquely for your baby's needs.  Breast milk improves your baby's brain development.  Your baby is less likely to develop other conditions, such as childhood obesity, asthma, or type 2 diabetes mellitus. For You  Breastfeeding helps to create a very special bond between you and your baby.  Breastfeeding is convenient. Breast milk is always available at the correct temperature and costs nothing.  Breastfeeding helps to burn calories and helps you lose the weight gained during  pregnancy.  Breastfeeding makes your uterus contract to its prepregnancy size faster and slows bleeding (lochia) after you give birth.   Breastfeeding helps to lower your risk of developing type 2 diabetes mellitus, osteoporosis, and breast or ovarian cancer later in life. SIGNS THAT YOUR BABY IS HUNGRY Early Signs of Hunger  Increased alertness or activity.  Stretching.  Movement of the head from side to side.  Movement of the head and opening of the mouth when the corner of the mouth or cheek is stroked (rooting).  Increased sucking sounds, smacking lips, cooing, sighing, or squeaking.  Hand-to-mouth movements.  Increased sucking of fingers or hands. Late Signs of Hunger  Fussing.  Intermittent crying. Extreme Signs of Hunger Signs of extreme hunger will require calming and consoling before your baby will be able to breastfeed successfully. Do not wait for the following signs of extreme hunger to occur before you initiate breastfeeding:  Restlessness.  A loud, strong cry.  Screaming. BREASTFEEDING BASICS Breastfeeding Initiation  Find a comfortable place to sit or lie down, with your neck and back well supported.  Place a pillow or rolled up blanket under your baby to bring him  her to the level of your breast (if you are seated). Nursing pillows are specially designed to help support your arms and your baby while you breastfeed. °· Make sure that your baby's abdomen is facing your abdomen. °· Gently massage your breast. With your fingertips, massage from your chest wall toward your nipple in a circular motion. This encourages milk flow. You may need to continue this action during the feeding if your milk flows slowly. °· Support your breast with 4 fingers underneath and your thumb above your nipple. Make sure your fingers are well away from your nipple and your baby's mouth. °· Stroke your baby's lips gently with your finger or nipple. °· When your baby's mouth is open  wide enough, quickly bring your baby to your breast, placing your entire nipple and as much of the colored area around your nipple (areola) as possible into your baby's mouth. °¨ More areola should be visible above your baby's upper lip than below the lower lip. °¨ Your baby's tongue should be between his or her lower gum and your breast. °· Ensure that your baby's mouth is correctly positioned around your nipple (latched). Your baby's lips should create a seal on your breast and be turned out (everted). °· It is common for your baby to suck about 2-3 minutes in order to start the flow of breast milk. °Latching °Teaching your baby how to latch on to your breast properly is very important. An improper latch can cause nipple pain and decreased milk supply for you and poor weight gain in your baby. Also, if your baby is not latched onto your nipple properly, he or she may swallow some air during feeding. This can make your baby fussy. Burping your baby when you switch breasts during the feeding can help to get rid of the air. However, teaching your baby to latch on properly is still the best way to prevent fussiness from swallowing air while breastfeeding. °Signs that your baby has successfully latched on to your nipple: °· Silent tugging or silent sucking, without causing you pain. °· Swallowing heard between every 3-4 sucks. °· Muscle movement above and in front of his or her ears while sucking. °Signs that your baby has not successfully latched on to nipple: °· Sucking sounds or smacking sounds from your baby while breastfeeding. °· Nipple pain. °If you think your baby has not latched on correctly, slip your finger into the corner of your baby's mouth to break the suction and place it between your baby's gums. Attempt breastfeeding initiation again. °Signs of Successful Breastfeeding °Signs from your baby: °· A gradual decrease in the number of sucks or complete cessation of sucking. °· Falling asleep. °· Relaxation  of his or her body. °· Retention of a small amount of milk in his or her mouth. °· Letting go of your breast by himself or herself. °Signs from you: °· Breasts that have increased in firmness, weight, and size 1-3 hours after feeding. °· Breasts that are softer immediately after breastfeeding. °· Increased milk volume, as well as a change in milk consistency and color by the fifth day of breastfeeding. °· Nipples that are not sore, cracked, or bleeding. °Signs That Your Baby is Getting Enough Milk °· Wetting at least 3 diapers in a 24-hour period. The urine should be clear and pale yellow by age 5 days. °· At least 3 stools in a 24-hour period by age 5 days. The stool should be soft and yellow. °· At least 3   stools in a 24-hour period by age 7 days. The stool should be seedy and yellow. °· No loss of weight greater than 10% of birth weight during the first 3 days of age. °· Average weight gain of 4-7 ounces (113-198 g) per week after age 4 days. °· Consistent daily weight gain by age 5 days, without weight loss after the age of 2 weeks. °After a feeding, your baby may spit up a small amount. This is common. °BREASTFEEDING FREQUENCY AND DURATION °Frequent feeding will help you make more milk and can prevent sore nipples and breast engorgement. Breastfeed when you feel the need to reduce the fullness of your breasts or when your baby shows signs of hunger. This is called "breastfeeding on demand." Avoid introducing a pacifier to your baby while you are working to establish breastfeeding (the first 4-6 weeks after your baby is born). After this time you may choose to use a pacifier. Research has shown that pacifier use during the first year of a baby's life decreases the risk of sudden infant death syndrome (SIDS). °Allow your baby to feed on each breast as long as he or she wants. Breastfeed until your baby is finished feeding. When your baby unlatches or falls asleep while feeding from the first breast, offer the  second breast. Because newborns are often sleepy in the first few weeks of life, you may need to awaken your baby to get him or her to feed. °Breastfeeding times will vary from baby to baby. However, the following rules can serve as a guide to help you ensure that your baby is properly fed: °· Newborns (babies 4 weeks of age or younger) may breastfeed every 1-3 hours. °· Newborns should not go longer than 3 hours during the day or 5 hours during the night without breastfeeding. °· You should breastfeed your baby a minimum of 8 times in a 24-hour period until you begin to introduce solid foods to your baby at around 6 months of age. °BREAST MILK PUMPING °Pumping and storing breast milk allows you to ensure that your baby is exclusively fed your breast milk, even at times when you are unable to breastfeed. This is especially important if you are going back to work while you are still breastfeeding or when you are not able to be present during feedings. Your lactation consultant can give you guidelines on how long it is safe to store breast milk. °A breast pump is a machine that allows you to pump milk from your breast into a sterile bottle. The pumped breast milk can then be stored in a refrigerator or freezer. Some breast pumps are operated by hand, while others use electricity. Ask your lactation consultant which type will work best for you. Breast pumps can be purchased, but some hospitals and breastfeeding support groups lease breast pumps on a monthly basis. A lactation consultant can teach you how to hand express breast milk, if you prefer not to use a pump. °CARING FOR YOUR BREASTS WHILE YOU BREASTFEED °Nipples can become dry, cracked, and sore while breastfeeding. The following recommendations can help keep your breasts moisturized and healthy: °· Avoid using soap on your nipples. °· Wear a supportive bra. Although not required, special nursing bras and tank tops are designed to allow access to your breasts  for breastfeeding without taking off your entire bra or top. Avoid wearing underwire-style bras or extremely tight bras. °· Air dry your nipples for 3-4 minutes after each feeding. °· Use only cotton bra pads   pads to absorb leaked breast milk. Leaking of breast milk between feedings is normal.  Use lanolin on your nipples after breastfeeding. Lanolin helps to maintain your skin's normal moisture barrier. If you use pure lanolin, you do not need to wash it off before feeding your baby again. Pure lanolin is not toxic to your baby. You may also hand express a few drops of breast milk and gently massage that milk into your nipples and allow the milk to air dry. In the first few weeks after giving birth, some women experience extremely full breasts (engorgement). Engorgement can make your breasts feel heavy, warm, and tender to the touch. Engorgement peaks within 3-5 days after you give birth. The following recommendations can help ease engorgement:  Completely empty your breasts while breastfeeding or pumping. You may want to start by applying warm, moist heat (in the shower or with warm water-soaked hand towels) just before feeding or pumping. This increases circulation and helps the milk flow. If your baby does not completely empty your breasts while breastfeeding, pump any extra milk after he or she is finished.  Wear a snug bra (nursing or regular) or tank top for 1-2 days to signal your body to slightly decrease milk production.  Apply ice packs to your breasts, unless this is too uncomfortable for you.  Make sure that your baby is latched on and positioned properly while breastfeeding. If engorgement persists after 48 hours of following these recommendations, contact your health care provider or a Science writer. OVERALL HEALTH CARE RECOMMENDATIONS WHILE BREASTFEEDING  Eat healthy foods. Alternate between meals and snacks, eating 3 of each per day. Because what you eat affects your breast milk,  some of the foods may make your baby more irritable than usual. Avoid eating these foods if you are sure that they are negatively affecting your baby.  Drink milk, fruit juice, and water to satisfy your thirst (about 10 glasses a day).  Rest often, relax, and continue to take your prenatal vitamins to prevent fatigue, stress, and anemia.  Continue breast self-awareness checks.  Avoid chewing and smoking tobacco. Chemicals from cigarettes that pass into breast milk and exposure to secondhand smoke may harm your baby.  Avoid alcohol and drug use, including marijuana. Some medicines that may be harmful to your baby can pass through breast milk. It is important to ask your health care provider before taking any medicine, including all over-the-counter and prescription medicine as well as vitamin and herbal supplements. It is possible to become pregnant while breastfeeding. If birth control is desired, ask your health care provider about options that will be safe for your baby. SEEK MEDICAL CARE IF:  You feel like you want to stop breastfeeding or have become frustrated with breastfeeding.  You have painful breasts or nipples.  Your nipples are cracked or bleeding.  Your breasts are red, tender, or warm.  You have a swollen area on either breast.  You have a fever or chills.  You have nausea or vomiting.  You have drainage other than breast milk from your nipples.  Your breasts do not become full before feedings by the fifth day after you give birth.  You feel sad and depressed.  Your baby is too sleepy to eat well.  Your baby is having trouble sleeping.   Your baby is wetting less than 3 diapers in a 24-hour period.  Your baby has less than 3 stools in a 24-hour period.  Your baby's skin or the white part of  his or her eyes becomes yellow.   Your baby is not gaining weight by 70 days of age. SEEK IMMEDIATE MEDICAL CARE IF:  Your baby is overly tired (lethargic) and does  not want to wake up and feed.  Your baby develops an unexplained fever.   This information is not intended to replace advice given to you by your health care provider. Make sure you discuss any questions you have with your health care provider.   Document Released: 02/22/2005 Document Revised: 11/13/2014 Document Reviewed: 08/16/2012 Elsevier Interactive Patient Education [employer].

## 2015-07-20 NOTE — Discharge Summary (Signed)
OB Discharge Summary     Patient Name: Shelby Mills DOB: 1990/04/22 MRN: [id number]  Date of admission: 07/18/2015 Delivering MD: Caren Macadam   Date of discharge: 07/20/2015  Admitting diagnosis: 38WKS,WATER BROKE Intrauterine pregnancy: [redacted]w[redacted]d     Secondary diagnosis:  Active Problems:   Active labor at term  Additional problems: GBS+, inadequate tx d/t rapid labor     Discharge diagnosis: Term Pregnancy Delivered                                                                                                Post partum procedures:none  Augmentation: none  Complications: None  Hospital course:  Onset of Labor With Vaginal Delivery     25 y.o. yo G3P3001 at [redacted]w[redacted]d was admitted in advanced Active Labor on 07/18/2015. Patient had an uncomplicated labor course as follows:  Membrane Rupture Time/Date: 8:30 AM ,07/18/2015   Intrapartum Procedures: Episiotomy: None [1]                                         Lacerations:  None [1]  Patient had a delivery of a Viable infant. 07/18/2015  Information for the patient's newborn:  Liliann, Derks [id number]  Delivery Method: Vaginal, Spontaneous Delivery (Filed from Delivery Summary)    Pateint had an uncomplicated postpartum course.  She is ambulating, tolerating a regular diet, passing flatus, and urinating well. Patient is discharged home in stable condition on 07/20/2015.    Physical exam  Filed Vitals:   07/18/15 1700 07/19/15 0549 07/19/15 1800 07/20/15 0500  BP: 125/65 111/73 125/85 118/62  Pulse: 72 61 67 70  Temp:  98.1 F (36.7 C) 99 F (37.2 C) 97.9 F (36.6 C)  TempSrc:  Oral Oral Oral  Resp: 18 16 17 18   SpO2:       General: alert, cooperative and no distress Lochia: appropriate Uterine Fundus: firm Incision: N/A DVT Evaluation: No evidence of DVT seen on physical exam. Negative Homan's sign. No cords or calf tenderness. No significant calf/ankle edema. Labs: Lab Results  Component Value Date   WBC 11.3* 07/18/2015   HGB 12.9 07/18/2015   HCT 37.5 07/18/2015   MCV 80.0 07/18/2015   PLT 250 07/18/2015   CMP Latest Ref Rng 09/29/2009  Glucose 70 - 99 mg/dL 93  BUN 6 - 23 mg/dL 5(L)  Creatinine 0.4 - 1.2 mg/dL 0.52  Sodium 135 - 145 mEq/L 136  Potassium 3.5 - 5.1 mEq/L 3.7  Chloride 96 - 112 mEq/L 109  CO2 19 - 32 mEq/L 20  Calcium 8.4 - 10.5 mg/dL 8.7  Total Protein 6.0 - 8.3 g/dL 5.3(L)  Total Bilirubin 0.3 - 1.2 mg/dL 0.3  Alkaline Phos 39 - 117 U/L 220(H)  AST 0 - 37 U/L 21  ALT 0 - 35 U/L 13    Discharge instruction: per After Visit Summary and "Baby and Me Booklet".  After visit meds:    Medication List    STOP taking these medications  calcium carbonate 500 MG chewable tablet  Commonly known as:  TUMS - dosed in mg elemental calcium      TAKE these medications        ibuprofen 600 MG tablet  Commonly known as:  ADVIL,MOTRIN  Take 1 tablet (600 mg total) by mouth every 6 (six) hours as needed for mild pain, moderate pain or cramping.     multivitamin-prenatal 27-0.8 MG Tabs tablet  Take 1 tablet by mouth daily at 12 noon.        Diet: routine diet  Activity: Advance as tolerated. Pelvic rest for 6 weeks.   Outpatient follow up:6 weeks Follow up Appt:No future appointments. Follow up Visit:No Follow-up on file.  Postpartum contraception: abstinence until Mirena IUD placement  Newborn Data: Live born female  Birth Weight: 7 lb 6.7 oz (3365 g) APGAR: 4, 7  Baby Feeding: Breast Disposition:home with mother   07/20/2015 Tawnya Crook, CNM

## 2015-09-03 ENCOUNTER — Ambulatory Visit: Payer: Medicaid Other | Admitting: Obstetrics & Gynecology

## 2015-09-08 ENCOUNTER — Ambulatory Visit (INDEPENDENT_AMBULATORY_CARE_PROVIDER_SITE_OTHER): Payer: Medicaid Other | Admitting: Obstetrics and Gynecology

## 2015-09-08 ENCOUNTER — Encounter: Payer: Self-pay | Admitting: Obstetrics and Gynecology

## 2015-09-08 DIAGNOSIS — F53 Postpartum depression: Secondary | ICD-10-CM

## 2015-09-08 DIAGNOSIS — Z30011 Encounter for initial prescription of contraceptive pills: Secondary | ICD-10-CM

## 2015-09-08 DIAGNOSIS — O99345 Other mental disorders complicating the puerperium: Secondary | ICD-10-CM

## 2015-09-08 MED ORDER — NORGESTIMATE-ETH ESTRADIOL 0.25-35 MG-MCG PO TABS: 1.0000 | ORAL_TABLET | Freq: Every day | ORAL | Status: DC

## 2015-09-08 MED ORDER — SERTRALINE HCL 50 MG PO TABS: 50.0000 mg | ORAL_TABLET | Freq: Every day | ORAL | Status: DC
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
## 2017-09-21 ENCOUNTER — Other Ambulatory Visit: Payer: Self-pay

## 2017-09-21 ENCOUNTER — Emergency Department
Admission: EM | Admit: 2017-09-21 | Discharge: 2017-09-21 | Disposition: A | Discharge status: Home / Self Care | Payer: Self-pay | Attending: Emergency Medicine | Admitting: Emergency Medicine

## 2017-09-21 ENCOUNTER — Emergency Department: Payer: Self-pay

## 2017-09-21 ENCOUNTER — Encounter: Payer: Self-pay | Admitting: Emergency Medicine

## 2017-09-21 DIAGNOSIS — O208 Other hemorrhage in early pregnancy: Secondary | ICD-10-CM

## 2017-09-21 DIAGNOSIS — O039 Complete or unspecified spontaneous abortion without complication: Secondary | ICD-10-CM | POA: Insufficient documentation

## 2017-09-21 DIAGNOSIS — R1030 Lower abdominal pain, unspecified: Secondary | ICD-10-CM

## 2017-09-21 DIAGNOSIS — O209 Hemorrhage in early pregnancy, unspecified: Secondary | ICD-10-CM

## 2017-09-21 DIAGNOSIS — O99330 Smoking (tobacco) complicating pregnancy, unspecified trimester: Secondary | ICD-10-CM | POA: Insufficient documentation

## 2017-09-21 DIAGNOSIS — F17210 Nicotine dependence, cigarettes, uncomplicated: Secondary | ICD-10-CM | POA: Insufficient documentation

## 2017-09-21 LAB — POCT PREGNANCY, URINE: Preg Test, Ur: POSITIVE — AB

## 2017-09-21 LAB — ABO/RH: ABO/RH(D): A POS

## 2017-09-21 LAB — HCG, QUANTITATIVE, PREGNANCY: hCG, Beta Chain, Quant, S: 107 m[IU]/mL — ABNORMAL HIGH (ref <5)

## 2017-09-21 NOTE — ED Notes (Signed)
Patient in [place] at this time

## 2017-09-21 NOTE — Discharge Instructions (Signed)
As we discussed, we believe that you have had what is known is a miscarriage, or a spontaneous abortion.  We recommend that you follow-up with an OB/GYN such as Dr. Leafy Ro at the next available opportunity for a repeat ultrasound and possibly some repeat lab work, as well as to discuss additional issues like we discussed such as birth control or tubal ligation.  Take over-the-counter ibuprofen and/or Tylenol as needed for your discomfort.  Return to the emergency department if you develop new or worsening symptoms that concern you.
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
## 2017-09-21 NOTE — ED Triage Notes (Signed)
Pt reports missed period last month, took 2 home pregnancy tests with + results. Pt reports today began having lower abdominal pain and vaginal bleeding, pt reports passing "little tiny pieces" with the bleeding. Pt reports bleeding started yesterday.

## 2017-09-21 NOTE — ED Notes (Addendum)
Pt's care discussed with Dr. Quentin Cornwall, [facility] for blood work and [place].

## 2017-09-21 NOTE — ED Notes (Signed)
Pt refuses second blood draw for add on blood work

## 2017-11-11 IMAGING — US US MFM OB COMP +14 WKS
1 series · 14 of 28 positions shown · non-contrast
Comparison: none

[Series 1: us mfm ob comp +14 wks · 85 acquisitions, 14 frames shown]
[im 4/85]
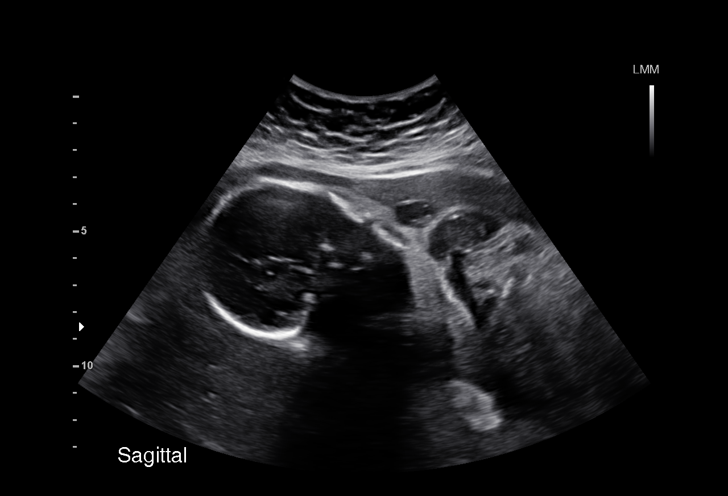
[im 10/85]
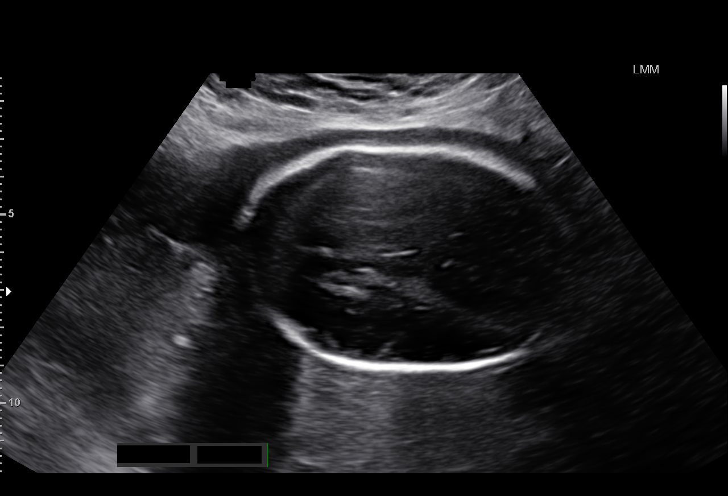
[im 16/85]
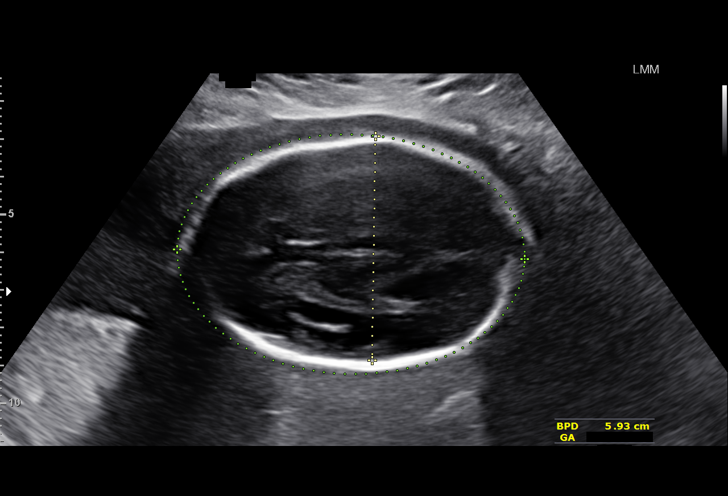
[im 22/85]
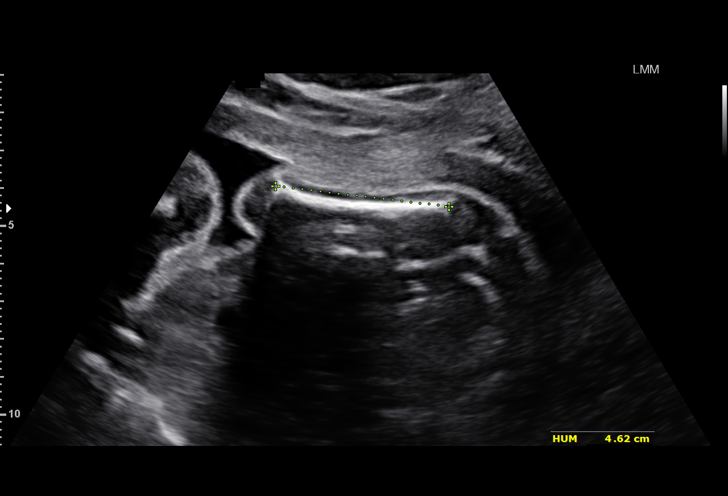
[im 29/85]
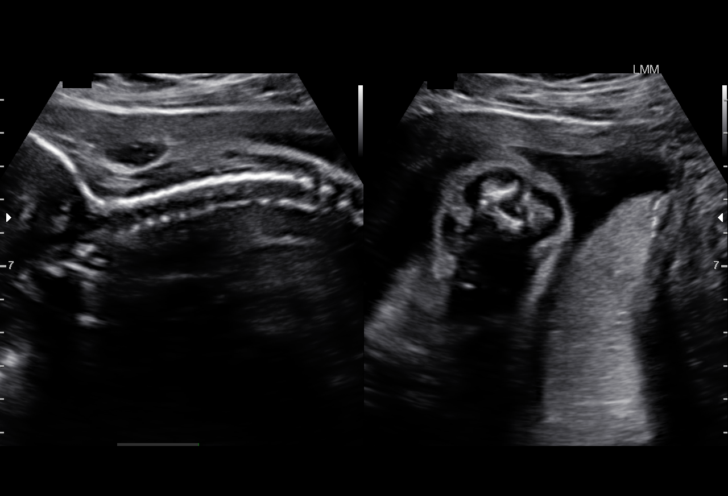
[im 35/85]
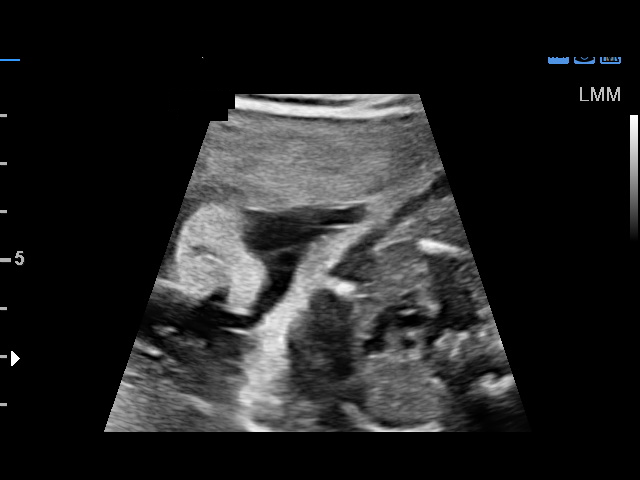
[im 41/85]
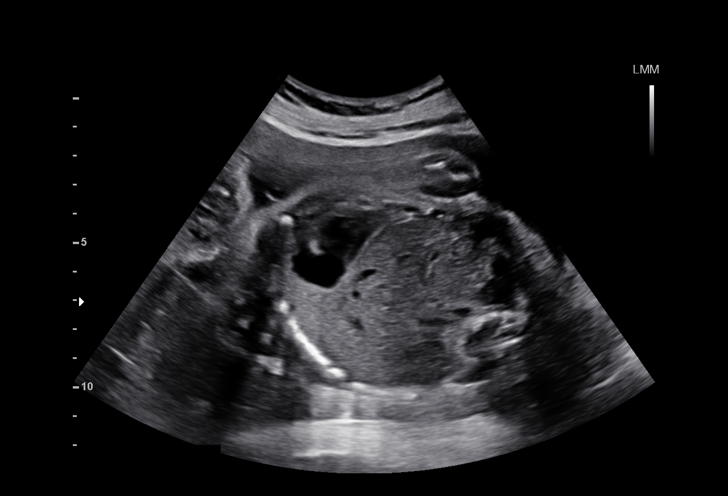
[im 47/85]
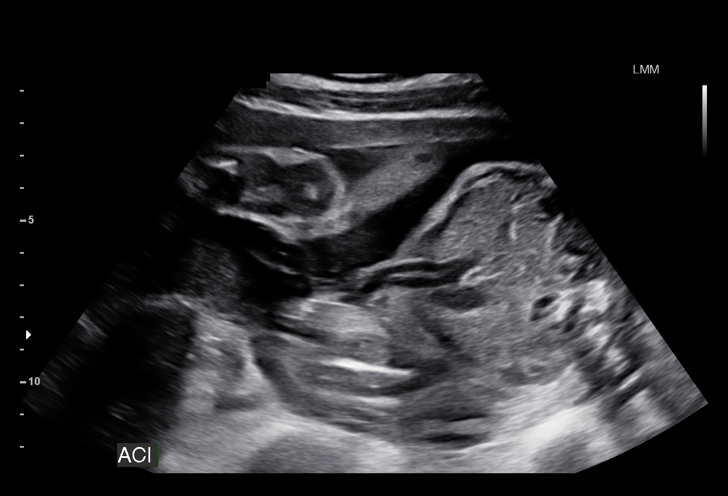
[im 53/85]
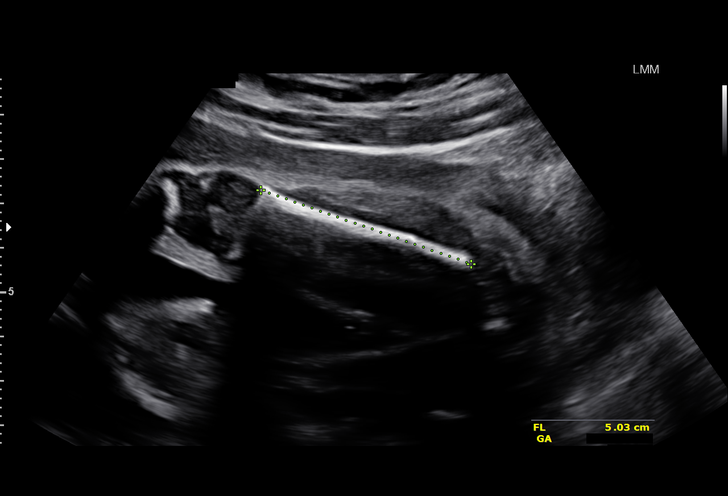
[im 60/85]
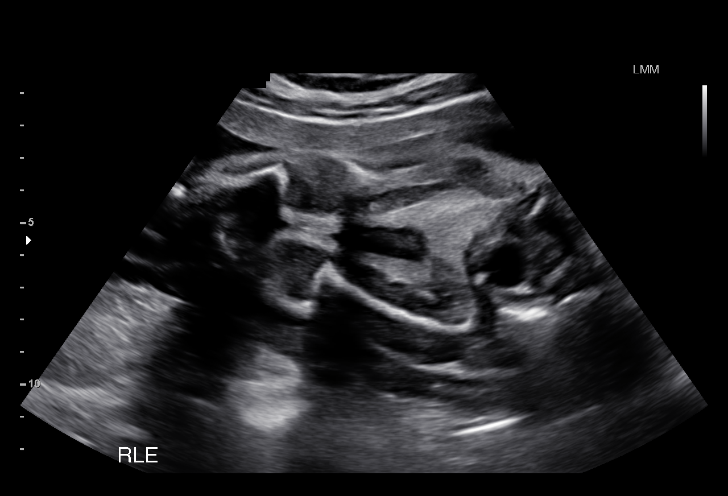
[im 66/85]
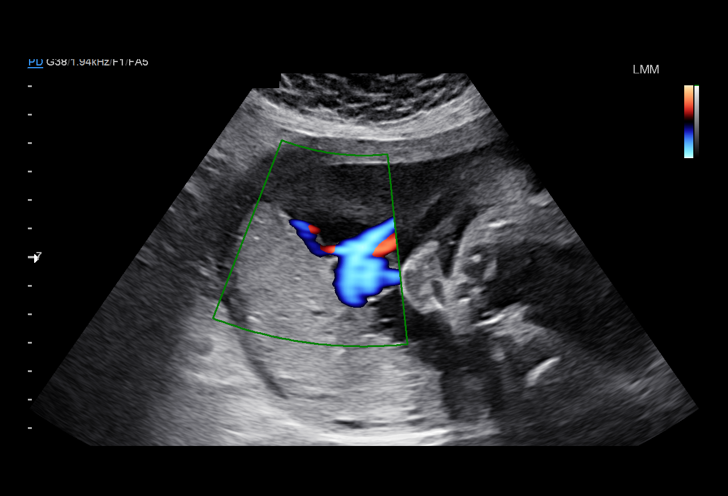
[im 72/85]
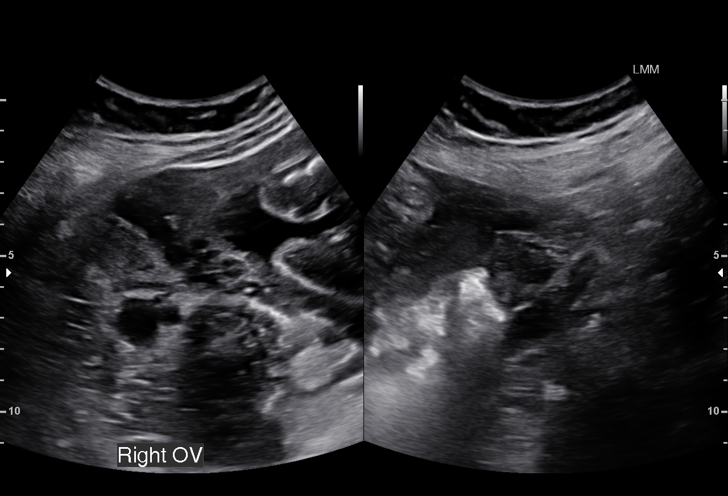
[im 78/85]
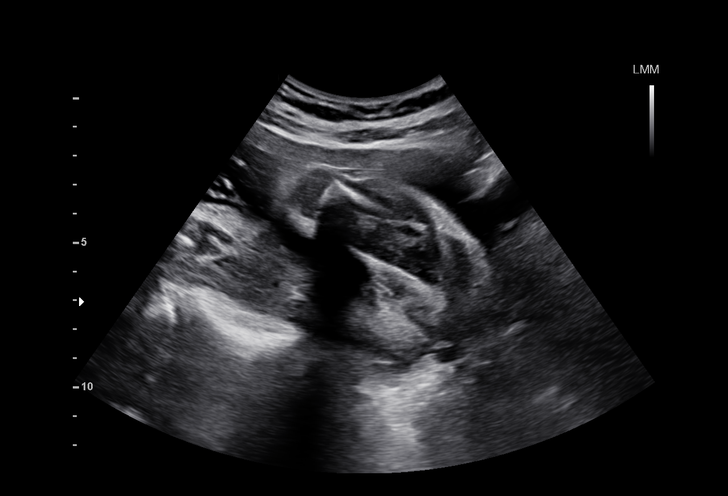
[im 85/85]
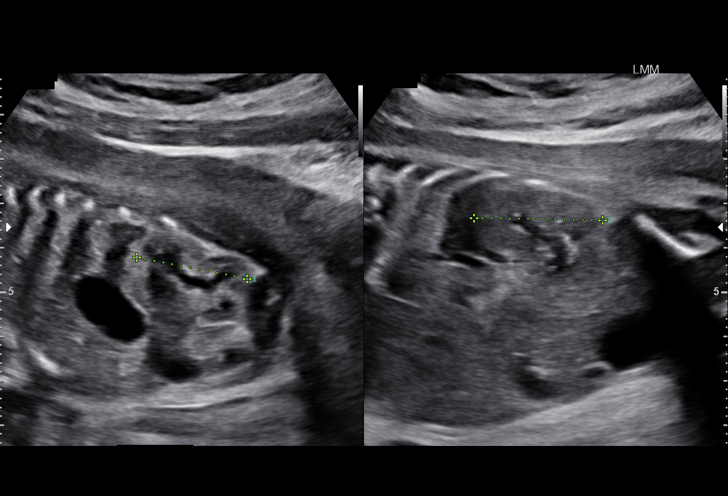

[14 of 28 positions shown; findings below may reference images not displayed]

1  CLEOPHAS REVERIEN SUNZU                [id number]       [id number]     [id number]
Indications

Late prenatal care, second trimester
Poor obstetric history: Previous gestational
diabetes
Basic anatomic survey                          Z36
Uncertain LMP,  Establish Gestational Age      Z36
26 weeks gestation of pregnancy
OB History

Gravidity:    3         Term:   2
Living:       0
Fetal Evaluation

Num Of Fetuses:     1
Fetal Heart         148
Rate(bpm):
Cardiac Activity:   Observed
Presentation:       Breech
Placenta:           Posterior, above cervical os
P. Cord Insertion:  Visualized, central

Amniotic Fluid
AFI FV:      Subjectively within normal limits
Larg Pckt:    5.5  cm
Biometry

BPD:      59.5  mm     G. Age:  24w 2d                  CI:         62.37  %    70 - 86
FL/HC:       20.7  %    18.6 -
HC:      243.2  mm     G. Age:  26w 3d         20  %    HC/AC:       1.00       1.05 -
AC:      242.5  mm     G. Age:  28w 4d         91  %    FL/BPD:      84.7  %    71 - 87
FL:       50.4  mm     G. Age:  27w 0d         51  %    FL/AC:       20.8  %    20 - 24
HUM:        46  mm     G. Age:  27w 1d         60  %
CM:        7.1  mm

Est. FW:    1222   gm     2 lb 6 oz     73  %
Gestational Age

U/S Today:     26w 4d                                        EDD:    07/28/15
Best:          26w 4d     Det. By:  U/S (04/25/15)           EDD:    07/28/15
Anatomy

Cranium:          Appears normal         Aortic Arch:      Not well visualized
Fetal Cavum:      Appears normal         Ductal Arch:      Not well visualized
Ventricles:       Appears normal         Diaphragm:        Appears normal
Choroid Plexus:   Appears normal         Stomach:          Appears normal, left
sided
Cerebellum:       Appears normal         Abdomen:          Appears normal
Posterior Fossa:  Appears normal         Abdominal Wall:   Appears nml (cord
insert, abd wall)
Nuchal Fold:      Not applicable (>20    Cord Vessels:     Appears normal (3
wks GA)                                  vessel cord)
Face:             Appears normal         Kidneys:          Appear normal
(orbits and profile)
Lips:             Appears normal         Bladder:          Appears normal
Fetal Thoracic:   Appears normal         Spine:            Appears normal
Heart:            Appears normal         Upper             Visualized
(4CH, axis, and        Extremities:
situs)
RVOT:             Appears normal         Lower             Appears normal
Extremities:
LVOT:             Appears normal

Other:  Fetus appears to be a female. Technically difficult due to maternal
habitus and fetal position.
Cervix Uterus Adnexa

Cervix
Length:            4.3  cm.
Normal appearance by transabdominal scan.

Uterus
Single fibroid noted, see table below.

Left Ovary
Within normal limits.

Right Ovary
Within normal limits.

Cul De Sac:   No free fluid seen.

Adnexa:       No abnormality visualized.
Myomas

Site                     L(cm)      W(cm)      D(cm)       Location
Fundus Left posterior    8.6        8.7        7.4         Subserosal

Blood Flow                 RI        PI       Comments
Impression

SIUP at 26+4 weeks
Normal detailed fetal anatomy; limited views of arches
Normal amniotic fluid volume
EDC based on today's measurements; 07/28/15
Fibroid uterus: see above for size and location
Recommendations

Follow-up ultrasound in4-6 weeks to complete anatomy
survey and reassess interval growth

## 2020-04-09 ENCOUNTER — Encounter: Payer: Self-pay | Admitting: Certified Nurse Midwife

## 2020-04-09 ENCOUNTER — Other Ambulatory Visit: Payer: Self-pay

## 2020-04-09 ENCOUNTER — Ambulatory Visit (INDEPENDENT_AMBULATORY_CARE_PROVIDER_SITE_OTHER): Payer: Self-pay | Admitting: Certified Nurse Midwife

## 2020-04-09 VITALS — BP 130/71 | HR 66 | Ht 70.0 in | Wt 180.4 lb

## 2020-04-09 DIAGNOSIS — N6452 Nipple discharge: Secondary | ICD-10-CM

## 2020-04-09 NOTE — Patient Instructions (Signed)
Galactorrhea Galactorrhea is the flow of a milky fluid (discharge) from the breast. It is different from normal milk in nursing mothers. The fluid can be white, yellow, or green. This condition can be caused by many things. Most cases are not serious and do not require treatment. Watch your condition to make sure it goes away. What are the causes?  Irritation of the breast. This may be caused by: ? Injury on the breast. ? Touching breasts during sex. ? Clothes rubbing against the nipple.  Some medicines, birth control pills, or herbs.  Changes in hormones.  Stress. What are the signs or symptoms? The main symptom of this condition is a milky discharge from the breast. The discharge may be white, yellow, or green. How is this treated? You may get well without treatment. Your doctor will watch your condition to make sure that it gets better. Sometimes your doctor will decide that you need treatment. If you need treatment:  The doctor will treat any injury or irritation on your breast.  The doctor will treat you for problems in your hormones.  You may be asked to stop some medicines, if they are causing your symptoms. Follow these instructions at home: Breast care  Watch your condition for any changes.  Do not squeeze your breasts or nipples.  Avoid touching your breasts when you are having sex.  Perform a breast self-exam once a month.  Avoid clothes that rub on your nipples.  Use breast pads to absorb the fluid.  Wear a support bra or a breast binder.   General instructions  Take over-the-counter and prescription medicines only as told by your doctor.  Keep all follow-up visits. Contact a doctor if:  You have hot flashes.  You have vaginal dryness.  You have no desire for sex.  You stop having periods, or have periods that are irregular or far apart.  You have headaches.  You cannot see well. Get help right away if:  Your breast discharge is bloody or  yellowish and looks like pus.  You have breast pain.  You feel a lump in your breast.  Your breast shows wrinkling or dimpling.  Your breast becomes red and swollen. Summary  Galactorrhea is the flow of a milky fluid (discharge) from the breast.  This condition may be caused by many things, but it is not usually serious.  Watch your condition carefully to make sure that it goes away.  Get help right away if the fluid is bloody or yellowish, or if you have a lump, pain, or skin changes on your breast. This information is not intended to replace advice given to you by your health care provider. Make sure you discuss any questions you have with your health care provider. Document Revised: 12/24/2019 Document Reviewed: 12/24/2019 Elsevier Patient Education  2021 [employer]vier Inc.  
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
## 2020-04-16 LAB — ANAEROBIC AND AEROBIC CULTURE
